# Patient Record
Sex: Male | Born: 1985 | Race: White | Hispanic: No | Marital: Single | Smoking: Never smoker
Health system: Southern US, Community
[De-identification: ages and names within clinical notes are randomized; demographics above are authoritative.]

## PROBLEM LIST (undated history)

## (undated) DIAGNOSIS — Z9660 Presence of unspecified orthopedic joint implant: Secondary | ICD-10-CM

## (undated) DIAGNOSIS — E761 Mucopolysaccharidosis, type II: Secondary | ICD-10-CM

## (undated) DIAGNOSIS — J9600 Acute respiratory failure, unspecified whether with hypoxia or hypercapnia: Secondary | ICD-10-CM

## (undated) DIAGNOSIS — Z9889 Other specified postprocedural states: Secondary | ICD-10-CM

## (undated) DIAGNOSIS — T884XXA Failed or difficult intubation, initial encounter: Secondary | ICD-10-CM

## (undated) DIAGNOSIS — D509 Iron deficiency anemia, unspecified: Secondary | ICD-10-CM

## (undated) DIAGNOSIS — Q6589 Other specified congenital deformities of hip: Secondary | ICD-10-CM

---

## 2011-04-19 ENCOUNTER — Ambulatory Visit: Payer: Self-pay

## 2011-06-27 ENCOUNTER — Encounter: Payer: Self-pay | Admitting: Orthopedic Surgery

## 2011-07-09 ENCOUNTER — Encounter: Payer: Self-pay | Admitting: Orthopedic Surgery

## 2011-11-21 ENCOUNTER — Encounter: Payer: Self-pay | Admitting: Orthopedic Surgery

## 2011-12-09 ENCOUNTER — Encounter: Payer: Self-pay | Admitting: Orthopedic Surgery

## 2012-05-23 ENCOUNTER — Ambulatory Visit: Payer: Self-pay | Admitting: Pediatrics

## 2012-08-29 ENCOUNTER — Ambulatory Visit: Payer: Self-pay | Admitting: Pediatrics

## 2013-07-19 ENCOUNTER — Emergency Department: Payer: Self-pay | Admitting: Emergency Medicine

## 2013-12-05 ENCOUNTER — Encounter: Payer: Self-pay | Admitting: Radiology

## 2013-12-08 ENCOUNTER — Encounter: Payer: Self-pay | Admitting: Radiology

## 2014-01-07 ENCOUNTER — Encounter: Payer: Self-pay | Admitting: Radiology

## 2014-04-21 ENCOUNTER — Encounter
Admit: 2014-04-21 | Disposition: A | Discharge status: Home / Self Care | Payer: Self-pay | Attending: Radiology | Admitting: Radiology

## 2014-05-09 ENCOUNTER — Encounter
Admit: 2014-05-09 | Disposition: A | Discharge status: Home / Self Care | Payer: Self-pay | Attending: Radiology | Admitting: Radiology

## 2015-04-27 ENCOUNTER — Emergency Department
Admission: EM | Admit: 2015-04-27 | Discharge: 2015-04-27 | Payer: Medicare Other | Attending: Emergency Medicine | Admitting: Emergency Medicine

## 2015-04-27 ENCOUNTER — Encounter: Payer: Self-pay | Admitting: Emergency Medicine

## 2015-04-27 ENCOUNTER — Emergency Department: Payer: Medicare Other

## 2015-04-27 DIAGNOSIS — E761 Mucopolysaccharidosis, type II: Secondary | ICD-10-CM | POA: Insufficient documentation

## 2015-04-27 DIAGNOSIS — J189 Pneumonia, unspecified organism: Secondary | ICD-10-CM

## 2015-04-27 DIAGNOSIS — J181 Lobar pneumonia, unspecified organism: Secondary | ICD-10-CM | POA: Insufficient documentation

## 2015-04-27 DIAGNOSIS — J9602 Acute respiratory failure with hypercapnia: Secondary | ICD-10-CM

## 2015-04-27 DIAGNOSIS — J9600 Acute respiratory failure, unspecified whether with hypoxia or hypercapnia: Secondary | ICD-10-CM | POA: Diagnosis not present

## 2015-04-27 DIAGNOSIS — Z7982 Long term (current) use of aspirin: Secondary | ICD-10-CM | POA: Diagnosis not present

## 2015-04-27 DIAGNOSIS — Z79899 Other long term (current) drug therapy: Secondary | ICD-10-CM | POA: Insufficient documentation

## 2015-04-27 DIAGNOSIS — A419 Sepsis, unspecified organism: Secondary | ICD-10-CM | POA: Insufficient documentation

## 2015-04-27 DIAGNOSIS — R0600 Dyspnea, unspecified: Secondary | ICD-10-CM | POA: Diagnosis present

## 2015-04-27 DIAGNOSIS — J9601 Acute respiratory failure with hypoxia: Secondary | ICD-10-CM

## 2015-04-27 HISTORY — DX: Mucopolysaccharidosis, type II: E76.1

## 2015-04-27 LAB — BLOOD GAS, ARTERIAL
ALLENS TEST (PASS/FAIL): POSITIVE — AB
Acid-Base Excess: 1.2 mmol/L (ref 0.0–3.0)
Acid-Base Excess: 2.9 mmol/L (ref 0.0–3.0)
Allens test (pass/fail): POSITIVE — AB
BICARBONATE: 29.9 meq/L — AB (ref 21.0–28.0)
BICARBONATE: 31.7 meq/L — AB (ref 21.0–28.0)
DELIVERY SYSTEMS: POSITIVE
EXPIRATORY PAP: 5
FIO2: 0.36
FIO2: 0.35
Inspiratory PAP: 18
O2 Saturation: 85.6 %
O2 Saturation: 89.4 %
PATIENT TEMPERATURE: 37
PATIENT TEMPERATURE: 37
PCO2 ART: 84 mmHg — AB (ref 32.0–48.0)
PO2 ART: 65 mmHg — AB (ref 83.0–108.0)
RATE: 8 resp/min
pCO2 arterial: 87 mmHg (ref 32.0–48.0)
pH, Arterial: 7.16 — CL (ref 7.350–7.450)
pH, Arterial: 7.17 — CL (ref 7.350–7.450)
pO2, Arterial: 72 mmHg — ABNORMAL LOW (ref 83.0–108.0)

## 2015-04-27 LAB — CBC WITH DIFFERENTIAL/PLATELET
BASOS ABS: 0 10*3/uL (ref 0–0.1)
EOS ABS: 0 10*3/uL (ref 0–0.7)
HCT: 17 % — ABNORMAL LOW (ref 40.0–52.0)
HEMOGLOBIN: 5 g/dL — AB (ref 13.0–18.0)
LYMPHS ABS: 2.7 10*3/uL (ref 1.0–3.6)
Lymphocytes Relative: 11 %
MCH: 24.1 pg — ABNORMAL LOW (ref 26.0–34.0)
MCHC: 29.7 g/dL — ABNORMAL LOW (ref 32.0–36.0)
MCV: 81.2 fL (ref 80.0–100.0)
Monocytes Absolute: 2 10*3/uL — ABNORMAL HIGH (ref 0.2–1.0)
Monocytes Relative: 8 %
Neutro Abs: 19.9 10*3/uL — ABNORMAL HIGH (ref 1.4–6.5)
PLATELETS: 919 10*3/uL — AB (ref 150–440)
RBC: 2.09 MIL/uL — AB (ref 4.40–5.90)
RDW: 17 % — ABNORMAL HIGH (ref 11.5–14.5)
WBC: 24.7 10*3/uL — AB (ref 3.8–10.6)

## 2015-04-27 LAB — COMPREHENSIVE METABOLIC PANEL
ALT: 36 U/L (ref 17–63)
AST: 38 U/L (ref 15–41)
Albumin: 3.7 g/dL (ref 3.5–5.0)
Alkaline Phosphatase: 148 U/L — ABNORMAL HIGH (ref 38–126)
Anion gap: 10 (ref 5–15)
BILIRUBIN TOTAL: 0.1 mg/dL — AB (ref 0.3–1.2)
BUN: 29 mg/dL — AB (ref 6–20)
CALCIUM: 8.7 mg/dL — AB (ref 8.9–10.3)
CHLORIDE: 100 mmol/L — AB (ref 101–111)
CO2: 26 mmol/L (ref 22–32)
CREATININE: 0.79 mg/dL (ref 0.61–1.24)
Glucose, Bld: 167 mg/dL — ABNORMAL HIGH (ref 65–99)
Potassium: 4.7 mmol/L (ref 3.5–5.1)
Sodium: 136 mmol/L (ref 135–145)
Total Protein: 7.4 g/dL (ref 6.5–8.1)

## 2015-04-27 LAB — TROPONIN I: TROPONIN I: 0.03 ng/mL (ref <0.031)

## 2015-04-27 LAB — APTT: aPTT: 29 seconds (ref 24–36)

## 2015-04-27 LAB — ABO/RH: ABO/RH(D): O NEG

## 2015-04-27 LAB — LACTIC ACID, PLASMA: LACTIC ACID, VENOUS: 3.1 mmol/L — AB (ref 0.5–2.0)

## 2015-04-27 LAB — TYPE AND SCREEN
ABO/RH(D): O NEG
ANTIBODY SCREEN: NEGATIVE

## 2015-04-27 LAB — PROTIME-INR
INR: 1.19
Prothrombin Time: 15.3 seconds — ABNORMAL HIGH (ref 11.4–15.0)

## 2015-04-27 MED ORDER — VANCOMYCIN HCL 10 G IV SOLR: 1000.0000 g | Freq: Once | INTRAVENOUS | Status: DC

## 2015-04-27 MED ORDER — VANCOMYCIN HCL IN DEXTROSE 1-5 GM/200ML-% IV SOLN
INTRAVENOUS | Status: AC
  Filled 2015-04-27: qty 200

## 2015-04-27 MED ORDER — VANCOMYCIN HCL IN DEXTROSE 1-5 GM/200ML-% IV SOLN
1000.0000 mg | Freq: Once | INTRAVENOUS | Status: AC
  Administered 2015-04-27: 1000 mg via INTRAVENOUS
  Filled 2015-04-27: qty 200

## 2015-04-27 MED ORDER — LORAZEPAM 2 MG/ML IJ SOLN
0.5000 mg | Freq: Once | INTRAMUSCULAR | Status: DC
  Filled 2015-04-27: qty 1

## 2015-04-27 MED ORDER — SODIUM CHLORIDE 0.9 % IV BOLUS (SEPSIS)
1000.0000 mL | Freq: Once | INTRAVENOUS | Status: AC
  Administered 2015-04-27: 1000 mL via INTRAVENOUS

## 2015-04-27 MED ORDER — PIPERACILLIN-TAZOBACTAM 3.375 G IVPB 30 MIN
3.3750 g | Freq: Once | INTRAVENOUS | Status: AC
  Administered 2015-04-27: 3.375 g via INTRAVENOUS
  Filled 2015-04-27: qty 50

## 2015-04-27 NOTE — ED Notes (Signed)
[facility] at bedside ready to leave; report given

## 2015-04-27 NOTE — ED Notes (Signed)
Pt presents to ED via EMS with respiratory distress. Tachycardic on arrival, pale appearance. Mother at bedside.

## 2015-04-27 NOTE — ED Provider Notes (Signed)
[facility] Emergency Department Provider Note  ____________________________________________  Time seen: Approximately 2:34 PM  I have reviewed the triage vital signs and the nursing notes.   HISTORY  Chief Complaint Respiratory Distress    HPI Alexander Chen is a 30 y.o. male with a history of Hunter's syndrome, recent right arthroplasty revision for hardware infection, presenting with respiratory distress. The patient is alert and able to answer some questions although he is confused. The remainder of the patient's history is obtained from his mother as well as [facility] documentation in care everywhere on Epic.  The patient's mother reports that for the last 5 days he has had a cough with fever to 100.3. He has had decreased sleep due to coughing, and increased from baseline anxiety. He has not had rhinorrhea or congestion, sore throat or ear pain. He has not had any syncopal episodes. He denies chest pain. The patient denies any pain, including right hip pain. EMS reports that on arrival, the patient had O2 sats in the mid 70s and CO2 also in the mid 70s.   Past Medical History  Diagnosis Date  . Huntington disease (HCC)     There are no active problems to display for this patient.   History reviewed. No pertinent past surgical history.  No current outpatient prescriptions on file.  Allergies Review of patient's allergies indicates no known allergies.  History reviewed. No pertinent family history.  Social History Social History  Substance Use Topics  . Smoking status: Never Smoker   . Smokeless tobacco: None  . Alcohol Use: No    Review of Systems Constitutional: Positive fever. No chills. No syncope. Eyes: No visual changes. No eye discharge. ENT: No sore throat. No congestion or rhinorrhea. Cardiovascular: Denies chest pain, palpitations. Respiratory: Positive shortness of breath.  Positive cough. Gastrointestinal: No abdominal pain.  No  nausea, no vomiting.  No diarrhea.  No constipation. Genitourinary: Negative for dysuria. Musculoskeletal: Negative for back pain. Negative for right hip pain. Skin: Negative for rash. Neurological: Negative for headaches, focal weakness or numbness. Positive altered mental status.  10-point ROS otherwise negative.  ____________________________________________   PHYSICAL EXAM:  VITAL SIGNS: ED Triage Vitals  Enc Vitals Group     BP 04/27/15 1424 159/92 mmHg     Pulse Rate 04/27/15 1424 135     Resp 04/27/15 1424 26     Temp 04/27/15 1424 98.1 F (36.7 C)     Temp Source 04/27/15 1424 Oral     SpO2 04/27/15 1424 100 %     Weight --      Height --      Head Cir --      Peak Flow --      Pain Score --      Pain Loc --      Pain Edu? --      Excl. in GC? --     Constitutional: Patient is alert and oriented to person only. He thinks he is in the ambulance and that is 2016. He is in respiratory distress on arrival. Patient is chronically ill-appearing with pallor Eyes: Conjunctivae are normal.  EOMI. no scleral icterus. No eye discharge. Head: Atraumatic. Nose: No congestion/rhinnorhea. Mouth/Throat: Mucous membranes are dry.  Neck: No stridor.  Supple.  Positive JVD. Cardiovascular: Rapid rate, regular rhythm. No murmurs, rubs or gallops.  Respiratory: Patient is in respiratory distress with tachypnea and able to speak only one to 2 word sentences. He has diffuse rales in the entire  left lung field without wheezing. Fair air exchange. On 4 L nasal cannula, O2 sats are in the mid 90s. Gastrointestinal: Soft and nontender. No distention. No peritoneal signs. Pelvis: Right surgical incision over the greater trochanter that is clean dry and intact without overlying erythema or discharge. No pain with palpation. Patient moves the right hip without discomfort.  Musculoskeletal: No LE edema. Diffuse contractures and scoliosis. Neurologic:  Patient is alert to person only. His speech  is clear. ASIS symmetric. Moves all extremities well. Skin:  Skin is warm, dry and intact. No rash noted. Positive pallor. Psychiatric: Affect is normal with anxious mood.  ____________________________________________   LABS (all labs ordered are listed, but only abnormal results are displayed)  Labs Reviewed  BLOOD GAS, ARTERIAL - Abnormal; Notable for the following:    pH, Arterial 7.16 (*)    pCO2 arterial 84 (*)    pO2, Arterial 65 (*)    Bicarbonate 29.9 (*)    Allens test (pass/fail) POSITIVE (*)    All other components within normal limits  CBC WITH DIFFERENTIAL/PLATELET - Abnormal; Notable for the following:    WBC 24.7 (*)    RBC 2.09 (*)    Hemoglobin 5.0 (*)    HCT 17.0 (*)    MCH 24.1 (*)    MCHC 29.7 (*)    RDW 17.0 (*)    Platelets 919 (*)    Neutro Abs 19.9 (*)    Monocytes Absolute 2.0 (*)    All other components within normal limits  PROTIME-INR - Abnormal; Notable for the following:    Prothrombin Time 15.3 (*)    All other components within normal limits  CULTURE, BLOOD (ROUTINE X 2)  CULTURE, BLOOD (ROUTINE X 2)  URINE CULTURE  APTT  COMPREHENSIVE METABOLIC PANEL  LACTIC ACID, PLASMA  LACTIC ACID, PLASMA  TROPONIN I  BLOOD GAS, VENOUS  URINALYSIS COMPLETEWITH MICROSCOPIC ([facility] ONLY)  TYPE AND SCREEN  ABO/RH   ____________________________________________  EKG  ED ECG REPORT I, Rockne Menghini, the attending physician, personally viewed and interpreted this ECG.   Date: 04/27/2015  EKG Time: 1423  Rate: 137  Rhythm: normal sinus rhythm  Axis: Normal  Intervals:none  ST&T Change: No ST elevation.  ____________________________________________  RADIOLOGY  Dg Chest Portable 1 View  04/27/2015  CLINICAL DATA:  Respiratory distress. Huntington's disease. Syncope today. EXAM: PORTABLE CHEST 1 VIEW COMPARISON:  04/19/2011 FINDINGS: The patient has a patchy infiltrate in the left lower lobe at the left lung base consistent with  pneumonia. Right lung is clear. Heart size and vascularity are normal. Tiny left effusion. Chronic severe thoracic scoliosis. IMPRESSION: Left lower lobe pneumonia. Electronically Signed   By: Francene Boyers M.D.   On: 04/27/2015 14:52    ____________________________________________   PROCEDURES  Procedure(s) performed: None  Critical Care performed: Yes, see critical care note(s) ____________________________________________   INITIAL IMPRESSION / ASSESSMENT AND PLAN / ED COURSE  Pertinent labs & imaging results that were available during my care of the patient were reviewed by me and considered in my medical decision making (see chart for details).  30 y.o. male with a history of Hunter's syndrome, recent right hip arthroplasty revision after infection, presenting with cough and respiratory distress. The patient had O2 sats in the 70s and CO2 level in the 70s I EMS. On arrival, his O2 sats was in the 80s on room air with a heart rate in the 130s and significant tachypnea. Clinically, he has a left-sided pneumonia and I will  treat him him. Clean with antibiotics and initiate fluid resuscitation. Mother describes that in the past he has required fiberoptic intubation with pediatric tools as he has a very small trachea. He is in significant respiratory distress, but continues to answer questions although he has some confusion. He does maintain his oxygen saturations when I give him supplemental O2. I have talked to his mom and the patient about intubation, and they are refusing at this time. We will immediately initiate BiPAP and get a blood gas to evaluate his respiratory status. I have contacted [facility] for immediate transfer, as the patient and his mother prefer to have the care there.  CRITICAL CARE Performed by: Rockne Menghini   Total critical care time: 60 minutes  Critical care time was exclusive of separately billable procedures and treating other patients.  Critical care was  necessary to treat or prevent imminent or life-threatening deterioration.  Critical care was time spent personally by me on the following activities: development of treatment plan with patient and/or surrogate as well as nursing, discussions with consultants, evaluation of patient's response to treatment, examination of patient, obtaining history from patient or surrogate, ordering and performing treatments and interventions, ordering and review of laboratory studies, ordering and review of radiographic studies, pulse oximetry and re-evaluation of patient's condition.   ----------------------------------------- 3:47 PM on 04/27/2015 -----------------------------------------  The patient was placed on BiPAP and at this time is breathing more comfortably with a decreased respiratory rate, O2 sats 100%, and a heart rate which is coming down to just over 100. He has received his antibiotics. [facility] for transfer and I am in discussions with [facility] about the fastest mode for transportation.  I had a long discussion with the patient's mother who is his POA. She continues to refuse intubation here. She understands the risks of transfer without intubation, including worsening of his status, worsening hypoxia, and respiratory arrest or death. She is willing to take that risk in order to transport the patient. At this time, all signs are that the patient is improving with our interventions, and I'm awaiting a call from [facility] for transportation.  ----------------------------------------- 4:59 PM on 04/27/2015 -----------------------------------------  The patient is being transported by [employer]. He does continue to have an O2 sat of 100% and has significantly improved his respiratory rate. At this time he is slightly somnolent but still arousable to voice. He is maintaining a normal blood pressure. A repeat ABG shows grossly unchanged pH but improving  hypercarbia and oxygenation. The patient is being transported in improving but still critical condition.  ____________________________________________  FINAL CLINICAL IMPRESSION(S) / ED DIAGNOSES  Final diagnoses:  Sepsis, due to unspecified organism (HCC)  Left lower lobe pneumonia  Acute respiratory failure with hypoxia and hypercapnia (HCC)      NEW MEDICATIONS STARTED DURING THIS VISIT:  New Prescriptions   No medications on file     Rockne Menghini, MD 04/27/15 2142

## 2015-05-02 LAB — CULTURE, BLOOD (ROUTINE X 2)
CULTURE: NO GROWTH
Culture: NO GROWTH

## 2015-05-14 ENCOUNTER — Other Ambulatory Visit: Payer: Self-pay | Admitting: Physician Assistant

## 2015-05-14 DIAGNOSIS — J189 Pneumonia, unspecified organism: Secondary | ICD-10-CM

## 2015-05-15 ENCOUNTER — Ambulatory Visit
Admission: RE | Admit: 2015-05-15 | Discharge: 2015-05-15 | Disposition: A | Discharge status: Home / Self Care | Payer: Medicare Other | Source: Ambulatory Visit | Attending: Physician Assistant | Admitting: Physician Assistant

## 2015-05-15 DIAGNOSIS — J189 Pneumonia, unspecified organism: Secondary | ICD-10-CM | POA: Diagnosis present

## 2015-05-23 ENCOUNTER — Encounter: Payer: Self-pay | Admitting: *Deleted

## 2015-05-23 ENCOUNTER — Inpatient Hospital Stay
Admission: EM | Admit: 2015-05-23 | Discharge: 2015-05-24 | DRG: 296 | Disposition: A | Discharge status: Other Acute Inpatient Hospital | Payer: Medicare Other | Attending: Internal Medicine | Admitting: Internal Medicine

## 2015-05-23 ENCOUNTER — Encounter
Admission: EM | Disposition: A | Discharge status: Other Acute Inpatient Hospital | Payer: Self-pay | Source: Home / Self Care | Attending: Internal Medicine

## 2015-05-23 ENCOUNTER — Emergency Department: Payer: Medicare Other

## 2015-05-23 DIAGNOSIS — I469 Cardiac arrest, cause unspecified: Principal | ICD-10-CM | POA: Diagnosis present

## 2015-05-23 DIAGNOSIS — D509 Iron deficiency anemia, unspecified: Secondary | ICD-10-CM | POA: Diagnosis present

## 2015-05-23 DIAGNOSIS — J9600 Acute respiratory failure, unspecified whether with hypoxia or hypercapnia: Secondary | ICD-10-CM | POA: Diagnosis present

## 2015-05-23 DIAGNOSIS — F419 Anxiety disorder, unspecified: Secondary | ICD-10-CM | POA: Diagnosis present

## 2015-05-23 DIAGNOSIS — Z79891 Long term (current) use of opiate analgesic: Secondary | ICD-10-CM

## 2015-05-23 DIAGNOSIS — G40409 Other generalized epilepsy and epileptic syndromes, not intractable, without status epilepticus: Secondary | ICD-10-CM | POA: Diagnosis present

## 2015-05-23 DIAGNOSIS — E872 Acidosis: Secondary | ICD-10-CM | POA: Diagnosis present

## 2015-05-23 DIAGNOSIS — Z4659 Encounter for fitting and adjustment of other gastrointestinal appliance and device: Secondary | ICD-10-CM

## 2015-05-23 DIAGNOSIS — R680 Hypothermia, not associated with low environmental temperature: Secondary | ICD-10-CM | POA: Diagnosis present

## 2015-05-23 DIAGNOSIS — Z7982 Long term (current) use of aspirin: Secondary | ICD-10-CM

## 2015-05-23 DIAGNOSIS — E761 Mucopolysaccharidosis, type II: Secondary | ICD-10-CM | POA: Diagnosis present

## 2015-05-23 DIAGNOSIS — J9602 Acute respiratory failure with hypercapnia: Secondary | ICD-10-CM | POA: Diagnosis present

## 2015-05-23 DIAGNOSIS — Z01818 Encounter for other preprocedural examination: Secondary | ICD-10-CM

## 2015-05-23 DIAGNOSIS — Z79899 Other long term (current) drug therapy: Secondary | ICD-10-CM

## 2015-05-23 DIAGNOSIS — Z7951 Long term (current) use of inhaled steroids: Secondary | ICD-10-CM

## 2015-05-23 DIAGNOSIS — J9601 Acute respiratory failure with hypoxia: Secondary | ICD-10-CM | POA: Diagnosis present

## 2015-05-23 HISTORY — DX: Other specified postprocedural states: Z98.890

## 2015-05-23 HISTORY — DX: Presence of unspecified orthopedic joint implant: Z96.60

## 2015-05-23 HISTORY — DX: Acute respiratory failure, unspecified whether with hypoxia or hypercapnia: J96.00

## 2015-05-23 HISTORY — DX: Iron deficiency anemia, unspecified: D50.9

## 2015-05-23 HISTORY — PX: INTUBATION-ENDOTRACHEAL WITH TRACHEOSTOMY STANDBY: SHX6592

## 2015-05-23 HISTORY — DX: Other specified congenital deformities of hip: Q65.89

## 2015-05-23 HISTORY — DX: Failed or difficult intubation, initial encounter: T88.4XXA

## 2015-05-23 LAB — CBC WITH DIFFERENTIAL/PLATELET
BASOS PCT: 0 %
Basophils Absolute: 0 10*3/uL (ref 0–0.1)
EOS ABS: 0.1 10*3/uL (ref 0–0.7)
EOS PCT: 1 %
HEMATOCRIT: 33.7 % — AB (ref 40.0–52.0)
HEMOGLOBIN: 11.2 g/dL — AB (ref 13.0–18.0)
LYMPHS PCT: 70 %
Lymphs Abs: 5.9 10*3/uL — ABNORMAL HIGH (ref 1.0–3.6)
MCH: 31.1 pg (ref 26.0–34.0)
MCHC: 33.3 g/dL (ref 32.0–36.0)
MCV: 93.3 fL (ref 80.0–100.0)
Monocytes Absolute: 0.5 10*3/uL (ref 0.2–1.0)
Monocytes Relative: 6 %
NEUTROS ABS: 2 10*3/uL (ref 1.4–6.5)
NEUTROS PCT: 23 %
Platelets: 355 10*3/uL (ref 150–440)
RBC: 3.62 MIL/uL — ABNORMAL LOW (ref 4.40–5.90)
RDW: 18.9 % — ABNORMAL HIGH (ref 11.5–14.5)
WBC: 8.5 10*3/uL (ref 3.8–10.6)

## 2015-05-23 LAB — URINALYSIS COMPLETE WITH MICROSCOPIC (ARMC ONLY)
BILIRUBIN URINE: NEGATIVE
Glucose, UA: 50 mg/dL — AB
HGB URINE DIPSTICK: NEGATIVE
Ketones, ur: NEGATIVE mg/dL
LEUKOCYTES UA: NEGATIVE
Nitrite: NEGATIVE
PH: 6 (ref 5.0–8.0)
PROTEIN: 100 mg/dL — AB
SQUAMOUS EPITHELIAL / LPF: NONE SEEN
Specific Gravity, Urine: 1.011 (ref 1.005–1.030)

## 2015-05-23 LAB — BASIC METABOLIC PANEL
Anion gap: 12 (ref 5–15)
BUN: 12 mg/dL (ref 6–20)
CHLORIDE: 106 mmol/L (ref 101–111)
CO2: 20 mmol/L — AB (ref 22–32)
CREATININE: 0.89 mg/dL (ref 0.61–1.24)
Calcium: 8 mg/dL — ABNORMAL LOW (ref 8.9–10.3)
GFR calc Af Amer: >60 mL/min (ref >60)
GFR calc non Af Amer: >60 mL/min (ref >60)
GLUCOSE: 285 mg/dL — AB (ref 65–99)
POTASSIUM: 4.2 mmol/L (ref 3.5–5.1)
SODIUM: 138 mmol/L (ref 135–145)

## 2015-05-23 LAB — TROPONIN I

## 2015-05-23 SURGERY — INTUBATION-ENDOTRACHEAL WITH TRACHEOSTOMY STANDBY
Anesthesia: General | Wound class: Clean

## 2015-05-23 MED ORDER — SODIUM CHLORIDE 0.9 % IV BOLUS (SEPSIS): 500.0000 mL | INTRAVENOUS | Status: DC

## 2015-05-23 MED ORDER — SODIUM CHLORIDE 0.9 % IV SOLN
INTRAVENOUS | Status: AC | PRN
  Administered 2015-05-23: 1000 mL via INTRAVENOUS

## 2015-05-23 MED ORDER — PIPERACILLIN-TAZOBACTAM 3.375 G IVPB 30 MIN
3.3750 g | Freq: Once | INTRAVENOUS | Status: AC
  Administered 2015-05-24: 3.375 g via INTRAVENOUS
  Filled 2015-05-23 (×2): qty 50

## 2015-05-23 MED ORDER — SODIUM CHLORIDE 0.9 % IV BOLUS (SEPSIS): 1000.0000 mL | Freq: Once | INTRAVENOUS | Status: DC

## 2015-05-23 MED ORDER — VANCOMYCIN HCL IN DEXTROSE 1-5 GM/200ML-% IV SOLN
1000.0000 mg | Freq: Once | INTRAVENOUS | Status: AC
  Administered 2015-05-24: 1000 mg via INTRAVENOUS
  Filled 2015-05-23 (×2): qty 200

## 2015-05-23 SURGICAL SUPPLY — 23 items
BLADE SURG 15 STRL LF DISP TIS (BLADE) IMPLANT
BLADE SURG 15 STRL SS (BLADE)
BLADE SURG SZ11 CARB STEEL (BLADE) IMPLANT
BRONCHOSCOPE PED SLIM DISP (MISCELLANEOUS) ×4 IMPLANT
CANISTER SUCT 1200ML W/VALVE (MISCELLANEOUS) ×4 IMPLANT
ELECT REM PT RETURN 9FT ADLT (ELECTROSURGICAL)
ELECTRODE REM PT RTRN 9FT ADLT (ELECTROSURGICAL) IMPLANT
GLOVE EXAM LX STRL 7.5 (GLOVE) IMPLANT
GOWN STRL REUS W/ TWL LRG LVL3 (GOWN DISPOSABLE) IMPLANT
GOWN STRL REUS W/TWL LRG LVL3 (GOWN DISPOSABLE)
HARMONIC SCALPEL FOCUS (MISCELLANEOUS) IMPLANT
NS IRRIG 500ML POUR BTL (IV SOLUTION) IMPLANT
PACK HEAD/NECK (MISCELLANEOUS) IMPLANT
SPONGE EXCIL AMD DRAIN 4X4 6P (MISCELLANEOUS) IMPLANT
SUCTION FRAZIER HANDLE 10FR (MISCELLANEOUS)
SUCTION TUBE FRAZIER 10FR DISP (MISCELLANEOUS) IMPLANT
SUT ETHILON 2 0 FS 18 (SUTURE) IMPLANT
SUT SILK 2 0 (SUTURE)
SUT SILK 2-0 18XBRD TIE 12 (SUTURE) IMPLANT
SUT VIC AB 4-0 RB1 27 (SUTURE)
SUT VIC AB 4-0 RB1 27X BRD (SUTURE) IMPLANT
TUBE TRACH SHILEY  6 DIST  CUF (TUBING) IMPLANT
TUBE TRACH SHILEY 8 DIST CUF (TUBING) IMPLANT

## 2015-05-23 NOTE — Anesthesia Preprocedure Evaluation (Addendum)
Anesthesia Evaluation  Patient identified by MRN, date of birth, ID band Patient confused    Reviewed: Allergy & Precautions, H&P , NPO status , Patient's Chart, lab work & pertinent test results  History of Anesthesia Complications Negative for: history of anesthetic complications  Airway Mallampati: Intubated  TM Distance: <3 FB Neck ROM: Limited    Dental   Pulmonary shortness of breath, asthma ,    Pulmonary exam normal breath sounds clear to auscultation       Cardiovascular Exercise Tolerance: Good (-) angina(-) Past MI and (-) DOE Normal cardiovascular exam Rhythm:regular Rate:Normal     Neuro/Psych negative neurological ROS  negative psych ROS   GI/Hepatic negative GI ROS, Neg liver ROS, neg GERD  ,  Endo/Other  negative endocrine ROS  Renal/GU negative Renal ROS  negative genitourinary   Musculoskeletal   Abdominal   Peds  Hematology negative hematology ROS (+)   Anesthesia Other Findings Past Medical History:   Hunter's syndrome, severe form (HCC)                         BMI    Body Mass Index   20.79 kg/m 2      Reproductive/Obstetrics negative OB ROS                             Anesthesia Physical Anesthesia Plan  ASA: IV and emergent  Anesthesia Plan: General ETT   Post-op Pain Management:    Induction: Inhalational  Airway Management Planned: Video Laryngoscope Planned, Fiberoptic Intubation Planned and Tracheostomy  Additional Equipment:   Intra-op Plan:   Post-operative Plan:   Informed Consent: I have reviewed the patients History and Physical, chart, labs and discussed the procedure including the risks, benefits and alternatives for the proposed anesthesia with the patient or authorized representative who has indicated his/her understanding and acceptance.   Dental Advisory Given  Plan Discussed with: Anesthesiologist, CRNA and Surgeon  Anesthesia  Plan Comments: (Patients mother consented.  She was informed that he is higher risk for complications from anesthesia during this procedure due to his medical history.  She voiced understanding. )       Anesthesia Quick Evaluation

## 2015-05-23 NOTE — Progress Notes (Signed)
Pharmacy Antibiotic Note  Sheryle SprayCody Soroka is a 30 y.o. male admitted on 05/23/2015 with sepsis.  Pharmacy has been consulted for Zosyn and vancomycin dosing.  Plan: 1. Zosyn 3.375 gm IV Q8H EI 2. Vancomycin 1 gm IV x 1 in ED followed by 750 mg IV Q12H, predicted trough 17 mcg/mL. Pharmacy will continue to follow and adjust as needed to maintain trough 15 to 20 mcg/mL.   Vd. 32.7 L, Ke 0.073 hr-1, T1/2 9.5 hr  Height: 4\' 11"  (149.9 cm) Weight: 103 lb (46.72 kg) IBW/kg (Calculated) : 47.7  Temp (24hrs), Avg:93.6 F (34.2 C), Min:90.7 F (32.6 C), Max:94.9 F (34.9 C)   Recent Labs Lab 05/23/15 2251  WBC 8.5  CREATININE 0.89    Estimated Creatinine Clearance: 82.4 mL/min (by C-G formula based on Cr of 0.89).    Allergies  Allergen Reactions  . Gabapentin Shortness Of Breath    Thank you for allowing pharmacy to be a part of this patient's care.  Carola FrostNathan A Hannan Tetzlaff, Pharm.D., BCPS Clinical Pharmacist 05/23/2015 11:58 PM

## 2015-05-23 NOTE — ED Notes (Addendum)
Pt recently dc'd from [facility] w/ dx of pneumonia. Brother called 911. Pt was being administered breathing treatment, began to have seizure-like activity. Family started CPR FOR 10-15 MINS prior to EMS arrival. Medical illustratorire dept. AED X 3 ROUNDS no shock advised, fire continued CPR via ElrosaLucas device. Pt has hx Hunter's syndrome. Mother and father present at bedside. EMS administered EPI X 1 AMP AND SOLUMEDROL 125 MG, return of pulses. [facility] airway in place.

## 2015-05-24 ENCOUNTER — Inpatient Hospital Stay: Payer: Medicare Other

## 2015-05-24 ENCOUNTER — Emergency Department: Payer: Medicare Other | Admitting: Anesthesiology

## 2015-05-24 ENCOUNTER — Inpatient Hospital Stay: Admit: 2015-05-24 | Payer: Medicare Other

## 2015-05-24 ENCOUNTER — Ambulatory Visit (HOSPITAL_COMMUNITY)
Admission: AD | Admit: 2015-05-24 | Discharge: 2015-05-24 | Disposition: A | Discharge status: Home / Self Care | Payer: Medicare Other | Source: Other Acute Inpatient Hospital | Attending: Internal Medicine | Admitting: Internal Medicine

## 2015-05-24 ENCOUNTER — Encounter: Payer: Self-pay | Admitting: Adult Health

## 2015-05-24 DIAGNOSIS — J9690 Respiratory failure, unspecified, unspecified whether with hypoxia or hypercapnia: Secondary | ICD-10-CM | POA: Insufficient documentation

## 2015-05-24 DIAGNOSIS — J9602 Acute respiratory failure with hypercapnia: Secondary | ICD-10-CM | POA: Diagnosis present

## 2015-05-24 DIAGNOSIS — J9601 Acute respiratory failure with hypoxia: Secondary | ICD-10-CM

## 2015-05-24 DIAGNOSIS — R680 Hypothermia, not associated with low environmental temperature: Secondary | ICD-10-CM | POA: Diagnosis present

## 2015-05-24 DIAGNOSIS — D509 Iron deficiency anemia, unspecified: Secondary | ICD-10-CM | POA: Diagnosis present

## 2015-05-24 DIAGNOSIS — I469 Cardiac arrest, cause unspecified: Secondary | ICD-10-CM | POA: Diagnosis present

## 2015-05-24 DIAGNOSIS — Z79891 Long term (current) use of opiate analgesic: Secondary | ICD-10-CM | POA: Diagnosis not present

## 2015-05-24 DIAGNOSIS — F419 Anxiety disorder, unspecified: Secondary | ICD-10-CM | POA: Diagnosis present

## 2015-05-24 DIAGNOSIS — Z79899 Other long term (current) drug therapy: Secondary | ICD-10-CM | POA: Diagnosis not present

## 2015-05-24 DIAGNOSIS — Z7982 Long term (current) use of aspirin: Secondary | ICD-10-CM | POA: Diagnosis not present

## 2015-05-24 DIAGNOSIS — E761 Mucopolysaccharidosis, type II: Secondary | ICD-10-CM | POA: Diagnosis present

## 2015-05-24 DIAGNOSIS — Z7951 Long term (current) use of inhaled steroids: Secondary | ICD-10-CM | POA: Diagnosis not present

## 2015-05-24 DIAGNOSIS — G40409 Other generalized epilepsy and epileptic syndromes, not intractable, without status epilepticus: Secondary | ICD-10-CM | POA: Diagnosis present

## 2015-05-24 DIAGNOSIS — E872 Acidosis: Secondary | ICD-10-CM | POA: Diagnosis present

## 2015-05-24 DIAGNOSIS — J9600 Acute respiratory failure, unspecified whether with hypoxia or hypercapnia: Secondary | ICD-10-CM | POA: Diagnosis present

## 2015-05-24 LAB — BLOOD GAS, ARTERIAL
Acid-Base Excess: 2.7 mmol/L (ref 0.0–3.0)
Acid-base deficit: 0.6 mmol/L (ref 0.0–2.0)
BICARBONATE: 26.2 meq/L (ref 21.0–28.0)
Bicarbonate: 27.9 mEq/L (ref 21.0–28.0)
FIO2: 0.28
FIO2: 0.25
MECHANICAL RATE: 12
MECHVT: 380 mL
Mechanical Rate: 14
O2 SAT: 98.3 %
O2 Saturation: 98.8 %
PATIENT TEMPERATURE: 37
PCO2 ART: 45 mmHg (ref 32.0–48.0)
PEEP: 5 cmH2O
PEEP: 5 cmH2O
PO2 ART: 135 mmHg — AB (ref 83.0–108.0)
PO2 ART: 111 mmHg — AB (ref 83.0–108.0)
Patient temperature: 37
VT: 380 mL
pCO2 arterial: 52 mmHg — ABNORMAL HIGH (ref 32.0–48.0)
pH, Arterial: 7.31 — ABNORMAL LOW (ref 7.350–7.450)
pH, Arterial: 7.4 (ref 7.350–7.450)

## 2015-05-24 LAB — BASIC METABOLIC PANEL
Anion gap: 6 (ref 5–15)
BUN: 15 mg/dL (ref 6–20)
CHLORIDE: 111 mmol/L (ref 101–111)
CO2: 24 mmol/L (ref 22–32)
CREATININE: 0.72 mg/dL (ref 0.61–1.24)
Calcium: 7.9 mg/dL — ABNORMAL LOW (ref 8.9–10.3)
GFR calc non Af Amer: >60 mL/min (ref >60)
Glucose, Bld: 147 mg/dL — ABNORMAL HIGH (ref 65–99)
POTASSIUM: 4 mmol/L (ref 3.5–5.1)
SODIUM: 141 mmol/L (ref 135–145)

## 2015-05-24 LAB — LACTIC ACID, PLASMA
LACTIC ACID, VENOUS: 1.2 mmol/L (ref 0.5–2.0)
Lactic Acid, Venous: 2 mmol/L (ref 0.5–2.0)

## 2015-05-24 LAB — PROCALCITONIN

## 2015-05-24 LAB — CBC
HCT: 32.7 % — ABNORMAL LOW (ref 40.0–52.0)
HEMOGLOBIN: 10.7 g/dL — AB (ref 13.0–18.0)
MCH: 30.4 pg (ref 26.0–34.0)
MCHC: 32.7 g/dL (ref 32.0–36.0)
MCV: 92.7 fL (ref 80.0–100.0)
Platelets: 258 10*3/uL (ref 150–440)
RBC: 3.53 MIL/uL — AB (ref 4.40–5.90)
RDW: 18.4 % — ABNORMAL HIGH (ref 11.5–14.5)
WBC: 6.8 10*3/uL (ref 3.8–10.6)

## 2015-05-24 LAB — GLUCOSE, CAPILLARY
GLUCOSE-CAPILLARY: 102 mg/dL — AB (ref 65–99)
GLUCOSE-CAPILLARY: 92 mg/dL (ref 65–99)
GLUCOSE-CAPILLARY: 110 mg/dL — AB (ref 65–99)
Glucose-Capillary: 152 mg/dL — ABNORMAL HIGH (ref 65–99)

## 2015-05-24 LAB — FIBRIN DERIVATIVES D-DIMER (ARMC ONLY): Fibrin derivatives D-dimer (ARMC): 1513 — ABNORMAL HIGH (ref 0–499)

## 2015-05-24 LAB — MAGNESIUM: MAGNESIUM: 2.1 mg/dL (ref 1.7–2.4)

## 2015-05-24 LAB — PHOSPHORUS: Phosphorus: 4 mg/dL (ref 2.5–4.6)

## 2015-05-24 LAB — ALBUMIN: ALBUMIN: 3.4 g/dL — AB (ref 3.5–5.0)

## 2015-05-24 LAB — MRSA PCR SCREENING: MRSA by PCR: NEGATIVE

## 2015-05-24 MED ORDER — ACETAMINOPHEN 650 MG RE SUPP: 650.0000 mg | Freq: Four times a day (QID) | RECTAL | Status: DC | PRN

## 2015-05-24 MED ORDER — FENTANYL 2500MCG IN NS 250ML (10MCG/ML) PREMIX INFUSION
25.0000 ug/h | INTRAVENOUS | Status: DC
  Administered 2015-05-24: 25 ug/h via INTRAVENOUS
  Filled 2015-05-24: qty 250

## 2015-05-24 MED ORDER — MEPERIDINE HCL 25 MG/ML IJ SOLN
INTRAMUSCULAR | Status: AC
  Filled 2015-05-24: qty 2

## 2015-05-24 MED ORDER — LEVETIRACETAM 500 MG/5ML IV SOLN: 1000.0000 mg | Freq: Two times a day (BID) | INTRAVENOUS | Status: DC

## 2015-05-24 MED ORDER — VECURONIUM BROMIDE 10 MG IV SOLR
10.0000 mg | Freq: Once | INTRAVENOUS | Status: AC
  Administered 2015-05-24: 10 mg via INTRAVENOUS
  Filled 2015-05-24: qty 10

## 2015-05-24 MED ORDER — ACETAMINOPHEN 160 MG/5ML PO SOLN
650.0000 mg | Freq: Four times a day (QID) | ORAL | Status: DC | PRN
Start: 2015-05-24 — End: 2015-05-24
  Administered 2015-05-24: 650 mg
  Filled 2015-05-24: qty 20.3

## 2015-05-24 MED ORDER — SODIUM CHLORIDE 0.9 % IV SOLN
1000.0000 mg | Freq: Once | INTRAVENOUS | Status: AC
  Administered 2015-05-24: 1000 mg via INTRAVENOUS
  Filled 2015-05-24: qty 10

## 2015-05-24 MED ORDER — CHLORHEXIDINE GLUCONATE 0.12% ORAL RINSE (MEDLINE KIT)
15.0000 mL | Freq: Two times a day (BID) | OROMUCOSAL | Status: DC
  Administered 2015-05-24: 15 mL via OROMUCOSAL
  Filled 2015-05-24 (×2): qty 15

## 2015-05-24 MED ORDER — ROCURONIUM BROMIDE 100 MG/10ML IV SOLN
INTRAVENOUS | Status: DC | PRN
  Administered 2015-05-24: 20 mg via INTRAVENOUS

## 2015-05-24 MED ORDER — LORAZEPAM 2 MG/ML IJ SOLN
INTRAMUSCULAR | Status: AC
  Filled 2015-05-24: qty 2

## 2015-05-24 MED ORDER — IPRATROPIUM-ALBUTEROL 0.5-2.5 (3) MG/3ML IN SOLN
3.0000 mL | Freq: Four times a day (QID) | RESPIRATORY_TRACT | Status: DC
  Filled 2015-05-24: qty 3

## 2015-05-24 MED ORDER — ACETAMINOPHEN 10 MG/ML IV SOLN
1000.0000 mg | Freq: Once | INTRAVENOUS | Status: AC
  Administered 2015-05-24: 1000 mg via INTRAVENOUS
  Filled 2015-05-24: qty 100

## 2015-05-24 MED ORDER — ALBUTEROL SULFATE HFA 108 (90 BASE) MCG/ACT IN AERS
INHALATION_SPRAY | RESPIRATORY_TRACT | Status: DC | PRN
  Administered 2015-05-24: 3 via RESPIRATORY_TRACT

## 2015-05-24 MED ORDER — LACTATED RINGERS IV SOLN
INTRAVENOUS | Status: DC
  Administered 2015-05-24: 01:00:00 via INTRAVENOUS

## 2015-05-24 MED ORDER — HEPARIN SODIUM (PORCINE) 5000 UNIT/ML IJ SOLN
5000.0000 [IU] | Freq: Three times a day (TID) | INTRAMUSCULAR | Status: DC
  Administered 2015-05-24 (×2): 5000 [IU] via SUBCUTANEOUS
  Filled 2015-05-24: qty 1

## 2015-05-24 MED ORDER — DEXAMETHASONE SODIUM PHOSPHATE 10 MG/ML IJ SOLN
INTRAMUSCULAR | Status: DC | PRN
  Administered 2015-05-24: 5 mg via INTRAVENOUS

## 2015-05-24 MED ORDER — FENTANYL BOLUS VIA INFUSION
50.0000 ug | INTRAVENOUS | Status: DC | PRN
  Filled 2015-05-24: qty 50

## 2015-05-24 MED ORDER — SODIUM CHLORIDE 0.9 % IV SOLN: 250.0000 mL | INTRAVENOUS | Status: DC | PRN

## 2015-05-24 MED ORDER — SODIUM CHLORIDE 0.9 % IV SOLN
24.0000 mg | Freq: Once | INTRAVENOUS | Status: AC
  Administered 2015-05-24: 24 mg via INTRAVENOUS
  Filled 2015-05-24: qty 12

## 2015-05-24 MED ORDER — MEPERIDINE HCL 25 MG/ML IJ SOLN
50.0000 mg | Freq: Once | INTRAMUSCULAR | Status: AC
  Administered 2015-05-24: 50 mg via INTRAVENOUS

## 2015-05-24 MED ORDER — ANTISEPTIC ORAL RINSE SOLUTION (CORINZ)
7.0000 mL | OROMUCOSAL | Status: DC
Start: 2015-05-24 — End: 2015-05-24
  Administered 2015-05-24 (×3): 7 mL via OROMUCOSAL
  Filled 2015-05-24 (×10): qty 7

## 2015-05-24 MED ORDER — PANTOPRAZOLE SODIUM 40 MG IV SOLR
40.0000 mg | Freq: Every day | INTRAVENOUS | Status: DC
  Administered 2015-05-24: 40 mg via INTRAVENOUS
  Filled 2015-05-24: qty 40

## 2015-05-24 MED ORDER — MIDAZOLAM HCL 5 MG/5ML IJ SOLN
INTRAMUSCULAR | Status: AC
  Filled 2015-05-24: qty 5

## 2015-05-24 MED ORDER — MIDAZOLAM HCL 2 MG/2ML IJ SOLN
INTRAMUSCULAR | Status: AC
  Administered 2015-05-24: 2 mg via INTRAVENOUS
  Filled 2015-05-24: qty 2

## 2015-05-24 MED ORDER — BISACODYL 10 MG RE SUPP: 10.0000 mg | Freq: Every day | RECTAL | Status: DC | PRN

## 2015-05-24 MED ORDER — ALBUTEROL SULFATE (2.5 MG/3ML) 0.083% IN NEBU: 2.5000 mg | INHALATION_SOLUTION | RESPIRATORY_TRACT | Status: DC | PRN

## 2015-05-24 MED ORDER — FENTANYL CITRATE (PF) 100 MCG/2ML IJ SOLN
50.0000 ug | Freq: Once | INTRAMUSCULAR | Status: AC
  Administered 2015-05-24: 50 ug via INTRAVENOUS
  Filled 2015-05-24: qty 2

## 2015-05-24 MED ORDER — VANCOMYCIN HCL IN DEXTROSE 750-5 MG/150ML-% IV SOLN
750.0000 mg | Freq: Two times a day (BID) | INTRAVENOUS | Status: DC
  Administered 2015-05-24: 750 mg via INTRAVENOUS
  Filled 2015-05-24 (×2): qty 150

## 2015-05-24 MED ORDER — PROPOFOL 1000 MG/100ML IV EMUL
INTRAVENOUS | Status: AC
  Filled 2015-05-24: qty 100

## 2015-05-24 MED ORDER — SODIUM CHLORIDE 0.9 % IV SOLN
500.0000 mg | Freq: Two times a day (BID) | INTRAVENOUS | Status: DC
  Filled 2015-05-24: qty 5

## 2015-05-24 MED ORDER — MIDAZOLAM HCL 2 MG/2ML IJ SOLN
2.0000 mg | INTRAMUSCULAR | Status: DC | PRN
Start: 2015-05-24 — End: 2015-05-24
  Administered 2015-05-24: 2 mg via INTRAVENOUS

## 2015-05-24 MED ORDER — MIDAZOLAM HCL 5 MG/ML IJ SOLN
1.0000 mg/h | INTRAMUSCULAR | Status: DC
  Administered 2015-05-24: 1 mg/h via INTRAVENOUS
  Administered 2015-05-24 (×2): 10 mg/h via INTRAVENOUS
  Filled 2015-05-24 (×3): qty 10

## 2015-05-24 MED ORDER — MIDAZOLAM HCL 2 MG/2ML IJ SOLN: 2.0000 mg | INTRAMUSCULAR | Status: DC | PRN

## 2015-05-24 MED ORDER — SENNOSIDES 8.8 MG/5ML PO SYRP: 5.0000 mL | ORAL_SOLUTION | Freq: Two times a day (BID) | ORAL | Status: DC | PRN

## 2015-05-24 MED ORDER — PIPERACILLIN-TAZOBACTAM 3.375 G IVPB
3.3750 g | Freq: Three times a day (TID) | INTRAVENOUS | Status: DC
Start: 2015-05-24 — End: 2015-05-24
  Administered 2015-05-24: 3.375 g via INTRAVENOUS
  Filled 2015-05-24 (×3): qty 50

## 2015-05-24 MED ORDER — LORAZEPAM 2 MG/ML IJ SOLN
4.0000 mg | Freq: Once | INTRAMUSCULAR | Status: AC
  Administered 2015-05-24: 4 mg via INTRAVENOUS

## 2015-05-24 MED ORDER — ONDANSETRON HCL 4 MG/2ML IJ SOLN: 4.0000 mg | Freq: Four times a day (QID) | INTRAMUSCULAR | Status: DC | PRN

## 2015-05-24 NOTE — ED Notes (Signed)
Zosyn and Vancocin pulled from main ED pyxis and sent with CRNAs to OR.

## 2015-05-24 NOTE — Progress Notes (Signed)
Spoke to Dr. Nicholos Johnsamachandran about patient's general condition and albumin level of 3.4. MD corrected calcium and stated he would not order calcium replacement at this time. Enzyme infusion changed to 24 mL/hr per instructions, patient with HR mid 90s, RR low 90s, no rash, and patient still febrile but lowering slightly. RN and family at bedside, twitching still present at decreased frequency and intensity (only in trunk and face now not in arms).

## 2015-05-24 NOTE — Progress Notes (Signed)
Patient rolled out with [facility]. New versed drip spiked and scanned and hung to go with [facility] during transfer.

## 2015-05-24 NOTE — Progress Notes (Signed)
Brief Nutrition Note  Consult received for enteral/tube feeding initiation and management. Spoke with Dr Ardyth Manam and does not want to start tube feeding at this time.  Pt will likely transfer.  Full assessment to follow if unable to transfer  Admitting Dx: Cardiac arrest (HCC) [I46.9]  Body mass index is 21.32 kg/(m^2).    Labs:   Recent Labs Lab 05/23/15 2251 05/24/15 0131  NA 138 141  K 4.2 4.0  CL 106 111  CO2 20* 24  BUN 12 15  CREATININE 0.89 0.72  CALCIUM 8.0* 7.9*  MG  --  2.1  PHOS  --  4.0  GLUCOSE 285* 147*    Samyuktha Brau B. Freida BusmanAllen, RD, LDN [phone] (pager) Weekend/On-Call pager [phone])

## 2015-05-24 NOTE — Progress Notes (Signed)
[facility] Patient to move to Critical Care Room 19. Family aware of transfer and at bedside with [facility].

## 2015-05-24 NOTE — Progress Notes (Signed)
Informed Dr. Nicholos Johnsamachandran about continued muscle twitching in upper extremities and calcium value of 7.9 with no albumin value. MD ordered RN to order ionized calcium check. RN also informed MD about urine turning red/pink from yellow (no clots visible). Patient with old blood in mouth when cleaned mouth but no other signs of bleeding at this time. No new orders about urine, besides for RN to continue to monitor.

## 2015-05-24 NOTE — Progress Notes (Signed)
Infusion of enzyme begun at 8 mL/hr. Patient already with fever, ST in low 100s., RR in low 20s. Family and RN at bedside.

## 2015-05-24 NOTE — Anesthesia Postprocedure Evaluation (Signed)
Anesthesia Post Note  Patient: Alexander Chen  Procedure(s) Performed: Procedure(s): INTUBATION-ENDOTRACHEAL WITH TRACHEOSTOMY STANDBY  Patient location during evaluation: SICU Anesthesia Type: General Level of consciousness: sedated Pain management: pain level controlled Vital Signs Assessment: post-procedure vital signs reviewed and stable Respiratory status: patient remains intubated per anesthesia plan Cardiovascular status: stable Anesthetic complications: no    Last Vitals:  Filed Vitals:   05/23/15 2328 05/23/15 2338  BP: 165/96 130/79  Pulse: 88 82  Temp: 34.8 C 34.9 C  Resp: 14 14    Last Pain: There were no vitals filed for this visit.               Cleda MccreedyJoseph K Piscitello

## 2015-05-24 NOTE — Transfer of Care (Signed)
Immediate Anesthesia Transfer of Care Note  Patient: Alexander Chen  Procedure(s) Performed: Procedure(s): INTUBATION-ENDOTRACHEAL WITH TRACHEOSTOMY STANDBY  Patient Location: ICU  Anesthesia Type:General  Level of Consciousness: sedated  Airway & Oxygen Therapy: Patient remains intubated per anesthesia plan  Post-op Assessment: Report given to RN and Post -op Vital signs reviewed and stable  Post vital signs: Reviewed and stable  Last Vitals:  Filed Vitals:   05/23/15 2328 05/23/15 2338  BP: 165/96 130/79  Pulse: 88 82  Temp: 34.8 C 34.9 C  Resp: 14 14    Complications: No apparent anesthesia complications

## 2015-05-24 NOTE — Progress Notes (Signed)
Patient had a tonic-clonic seizure at about 05:15am. Seizure lasted about 4 minutes. He continues to have "jerky movements" in bilateral upper extremities. Versed 4mg  IV push given, keppra 1 gram IV and 500 mg Q12H, Continuous EEG and neurology consult ordered.  [facility]. Pola CornELINK MD notified about family's request. Recommended covering MD should initiate [facility] transfer during day shift. Patient's parents notified.

## 2015-05-24 NOTE — ED Provider Notes (Signed)
[facility] Emergency Department Provider Note  ____________________________________________  Time seen: Seen upon arrival to the emergency department  I have reviewed the triage vital signs and the nursing notes.   HISTORY  Chief Complaint Cardiac Arrest    HPI Alexander SprayCody Chen is a 30 y.o. male with a history of Hunter's syndrome and recent discharge from this hospital for pneumonia who is presenting to the emergency department tonight post code. Per his family he was sitting calmly at home when he had a sudden onset of anxiety. The then described his neck is turning red in him saying that he was having difficulty breathing. They then attempted to put an oxygen mask on him but the patient then turned blue, stiffened and seemed to have seizure activity and then went unresponsive. The family started CPR at home and then called 911. They performed CPR for about 15 minutes until the medics arrived. At that point [facility] was established and CPR was continued. EMS gave one dose of epinephrine and had spontaneous return of circulation.  The family said that he was in a good state of health prior to this acute onset of the event this evening. The mother is suspecting that the event was brought on by the anxiety. She said there was a similar episode about one month ago but that it did not result in the patient needing CPR and his heart stopping.  Per the medics, the initial rhythm was pulseless electrical activity.  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   Past Medical History  Diagnosis Date  . Hunter's syndrome, severe form (HCC)     There are no active problems to display for this patient.   History reviewed. No pertinent past surgical history.  Current Outpatient Rx  Name  Route  Sig  Dispense  Refill   . acetaminophen (TYLENOL) 500 MG tablet   Oral   Take 1,000 mg by mouth every 6 (six) hours as needed for mild pain or headache.         . albuterol (PROVENTIL HFA;VENTOLIN HFA) 108 (90 Base) MCG/ACT inhaler   Inhalation   Inhale 2 puffs into the lungs every 4 (four) hours as needed for wheezing or shortness of breath.         . ALPRAZolam (XANAX) 0.25 MG tablet   Oral   Take 0.25 mg by mouth daily as needed for anxiety.          Marland Kitchen. aspirin EC 81 MG tablet   Oral   Take 81 mg by mouth daily.         . Calcium Carbonate-Vitamin D (CALCIUM 600+D) 600-400 MG-UNIT tablet   Oral   Take 1 tablet by mouth 2 (two) times daily.         . citalopram (CELEXA) 20 MG tablet   Oral   Take 20 mg by mouth daily. Pt takes with a 40mg  tablet.         . citalopram (CELEXA) 40 MG tablet   Oral   Take 40 mg by mouth daily. Pt takes with a 20mg  tablet.         . Idursulfase (ELAPRASE) 6 MG/3ML SOLN   Intravenous   Inject 30 mg into the vein once a week. Medication is infused over three hours.         .Marland Kitchen  oxyCODONE (OXY IR/ROXICODONE) 5 MG immediate release tablet   Oral   Take 5-15 mg by mouth every 3 (three) hours as needed for severe pain.           Allergies Gabapentin  History reviewed. No pertinent family history.  Social History Social History  Substance Use Topics  . Smoking status: Never Smoker   . Smokeless tobacco: None  . Alcohol Use: No    Review of Systems  Caveat secondary to patient being unresponsive.  ____________________________________________   PHYSICAL EXAM:  VITAL SIGNS: ED Triage Vitals  Enc Vitals Group     BP 05/23/15 2230 167/94 mmHg     Pulse Rate 05/23/15 2230 85     Resp 05/23/15 2230 10     Temp 05/23/15 2242 92.7 F (33.7 C)     Temp Source 05/23/15 2242 Rectal     SpO2 05/23/15 2230 100 %     Weight 05/23/15 2344 103 lb (46.72 kg)     Height 05/23/15 2344  (1.499 m)     Head Cir --      Peak Flow --      Pain  Score --      Pain Loc --      Pain Edu? --      Excl. in GC? --     Constitutional: Patient taking agonal respirations with a King airway in. No purposeful movement otherwise. Eyes: Conjunctivae are normal. Pupils are 3-4 mm and fixed bilaterally. Head: Atraumatic. Nose: No congestion/rhinnorhea. Mouth/Throat: King airway in place. Neck: No stridor.   Cardiovascular: Normal rate, regular rhythm. Grossly normal heart sounds.  Good peripheral circulation. Respiratory: Spontaneous, agonal respirations. Bilateral breath sounds heard. Gastrointestinal: Soft with mild distention.  Musculoskeletal: No lower extremity edema.  No joint effusions. Neurologic:  GCS of 3 Skin:  Skin is warm, dry and intact. No rash noted.   ____________________________________________   LABS (all labs ordered are listed, but only abnormal results are displayed)  Labs Reviewed  CBC WITH DIFFERENTIAL/PLATELET - Abnormal; Notable for the following:    RBC 3.62 (*)    Hemoglobin 11.2 (*)    HCT 33.7 (*)    RDW 18.9 (*)    Lymphs Abs 5.9 (*)    All other components within normal limits  BASIC METABOLIC PANEL - Abnormal; Notable for the following:    CO2 20 (*)    Glucose, Bld 285 (*)    Calcium 8.0 (*)    All other components within normal limits  URINALYSIS COMPLETEWITH MICROSCOPIC ([facility] ONLY) - Abnormal; Notable for the following:    Color, Urine YELLOW (*)    APPearance CLEAR (*)    Glucose, UA 50 (*)    Protein, ur 100 (*)    Bacteria, UA RARE (*)    All other components within normal limits  CULTURE, BLOOD (ROUTINE X 2)  CULTURE, BLOOD (ROUTINE X 2)  URINE CULTURE  TROPONIN I  LACTIC ACID, PLASMA  LACTIC ACID, PLASMA   ____________________________________________  EKG  ED ECG REPORT I, Schaevitz,  Teena Irani, the attending physician, personally viewed and interpreted this ECG.   Date: 05/24/2015  EKG Time: 2229  Rate: 91  Rhythm: normal sinus rhythm  Axis: Normal  Intervals:none   ST&T Change: No ST segment elevation or depression. No abnormal T-wave inversion.  ____________________________________________  RADIOLOGY      DG Chest 1 View (Final result) Result time: 05/23/15 23:56:36   Final result by Rad Results In Interface (05/23/15 23:56:36)  Narrative:   CLINICAL DATA: Respiratory distress. Seizure like activity.  EXAM: CHEST 1 VIEW  COMPARISON: 05/15/2015  FINDINGS: A single supine portable view the chest is negative for large pneumothorax or large effusion. The lungs are grossly clear. Mediastinal and hilar contours are unremarkable unchanged.  IMPRESSION: No acute findings.   Electronically Signed By: Ellery Plunk M.D. On: 05/23/2015 23:56    ____________________________________________   PROCEDURES  CRITICAL CARE Performed by: Arelia Longest   Total critical care time: 35 minutes  Critical care time was exclusive of separately billable procedures and treating other patients.  Critical care was necessary to treat or prevent imminent or life-threatening deterioration.  Critical care was time spent personally by me on the following activities: development of treatment plan with patient and/or surrogate as well as nursing, discussions with consultants, evaluation of patient's response to treatment, examination of patient, obtaining history from patient or surrogate, ordering and performing treatments and interventions, ordering and review of laboratory studies, ordering and review of radiographic studies, pulse oximetry and re-evaluation of patient's condition.  ____________________________________________   INITIAL IMPRESSION / ASSESSMENT AND PLAN / ED COURSE  Pertinent labs & imaging results that were available during my care of the patient were reviewed by me and considered in my medical decision making (see chart for details).  ----------------------------------------- 12:31 AM on  05/24/2015 -----------------------------------------  Patient was saturating 100% with reassuring vital signs except for his hypothermia. Because the mother said that he has been a very difficult intubation in the past, anesthesia as well as ear nose and throat were called to the bedside. There was a prolonged discussion had with the mother who initially was opposed to the patient being intubated because of the difficult airway in the past. Both the anesthesiologist and myself discussed with her the need to convert the [facility] airway over to an endotracheal tube for airway protection reasons. It was my concern that, not having a definitive airway in, that the patient may aspirate and further damage including death may be caused. I also explained to the parents that due to the patient's prolonged CPR that he may have sustained brain injury from not having the benefits of the normal beating heart.  The mother suggested at one point that we keep the [facility] airway and until the patient "wakes up." However, I do not feel that this is a safe option at this time due to the recent events. It is unclear exactly what caused the patient's initial arrest. Possible upper airway obstruction versus sepsis, versus PE. Dr. Earnie Larsson of anesthesia as well as Dr.Jeungle of ENT will be attended the patient in the operating room. I discussed case with the ICU attending, Dr. Darrick Penna, who has accepted the patient to the ICU. The mother is also aware that a surgical airway may be needed. ____________________________________________   FINAL CLINICAL IMPRESSION(S) / ED DIAGNOSES  Cardiac arrest.    Myrna Blazer, MD 05/24/15 [phone]

## 2015-05-24 NOTE — Progress Notes (Signed)
Report called to [facility] RN 

## 2015-05-24 NOTE — Progress Notes (Addendum)
Enzyme infusion rate changed to 16 mL/hr per instructions. RN and family at bedside. HR in upper 90s SR, RR in low 20s, no signs of rash, patient still febrile but temperature has not increased at this time. Patient's muscle twitching appears to be decreasing in frequency and intensity per RN and family observation.

## 2015-05-24 NOTE — Progress Notes (Signed)
Rate of enzyme infusion increased to 32 mL/hr per instructions. HR in low 90s, RR in low 20s, still febrile but lowering slightly, no rash. Family present.

## 2015-05-24 NOTE — H&P (Signed)
PULMONARY / CRITICAL CARE MEDICINE   Name: Alexander Chen MRN: [id number] DOB: 02/16/1985    ADMISSION DATE:  05/23/2015  REFERRING MD:  ED  CHIEF COMPLAINT:  Cardiopulmonary arrest  HISTORY OF PRESENT ILLNESS:  This is a 30 year old Caucasian male with a past medical history of Hunter syndrome, iron deficiency anemia, and respiratory failure who presented to the emergency room via EMS following cardiopulmonary arrest at home. History is obtained from EMS and ED records, as well as from patient's parents. Patient is currently intubated and unresponsive. Per ED records and patient's family, patient was receiving a breathing treatment for SOB when he had a seizure-like activity, became unresponsive and stopped breathing. CPR was initiated by his parents and continued for about 15 minutes prior to EMS arrival. Upon EMS arrival, CPR was continued, patient was given 1 dose of epinephrine IV push, Solu-Medrol 125 mg and a King airway was placed. He did have return of spontaneous circulation and was transported to the emergency room. Total downtime estimated at 25 minutes and initial rhythm was PEA. Prior to event, patient reported feeling SOB. Patient has a prior history of a difficult airway (i.e. Small trachea), hence ENT was called to evaluate patient emergently for possible oral intubation or a tracheostomy. Patient was taken emergently to the operating room and intubated orally by ENT. He is currently hemodynamically stable with mean arterial blood pressures ranging between 90 and 110. Hypothermic with core temperature of 34.1 degrees celsius.  Patient's parents state that he was recently hospitalized and treated for pneumonia  At [facility] about a month ago and was discharged home with nebulizer treatments. He reported feeling short of breath frequently post-hospitalization but his home O2 saturation was normal. Per his mother, about a week ago, he had an episode where he complained of shortness of breath and  suddenly his "eyes rolled back", he clinched  his teeth and became unresponsive, had twitching in his upper extremities and had urinary incontinence. Episode lasted a few minutes and he recovered completely.  Patient's family have requested that patient be transferred to [facility] center so he can be consulted by his Geneticist [facility] syndrome). Transfer to [facility] initiated.  PAST MEDICAL HISTORY :  He  has a past medical history of Hunter's syndrome, severe form (HCC); Acetabular dysplasia; H/O arthroplasty; Iron deficiency anemia; Acute respiratory failure (HCC); and Difficult intubation.  PAST SURGICAL HISTORY: He  has no past surgical history on file.  Allergies  Allergen Reactions  . Gabapentin Shortness Of Breath    No current facility-administered medications on file prior to encounter.   Current Outpatient Prescriptions on File Prior to Encounter  Medication Sig  . acetaminophen (TYLENOL) 500 MG tablet Take 1,000 mg by mouth every 6 (six) hours as needed for mild pain or headache.  . albuterol (PROVENTIL HFA;VENTOLIN HFA) 108 (90 Base) MCG/ACT inhaler Inhale 2 puffs into the lungs every 4 (four) hours as needed for wheezing or shortness of breath.  . ALPRAZolam (XANAX) 0.25 MG tablet Take 0.25 mg by mouth daily as needed for anxiety.   Marland Kitchen aspirin EC 81 MG tablet Take 81 mg by mouth daily.  . Calcium Carbonate-Vitamin D (CALCIUM 600+D) 600-400 MG-UNIT tablet Take 1 tablet by mouth 2 (two) times daily.  . citalopram (CELEXA) 20 MG tablet Take 20 mg by mouth daily. Pt takes with a  tablet.  . citalopram (CELEXA) 40 MG tablet Take 40 mg by mouth daily. Pt takes with a  tablet.  . Idursulfase (  ELAPRASE) 6 MG/3ML SOLN Inject 30 mg into the vein once a week. Medication is infused over three hours.  Marland Kitchen. oxyCODONE (OXY IR/ROXICODONE) 5 MG immediate release tablet Take 5-15 mg by mouth every 3 (three) hours as needed for severe pain.    FAMILY HISTORY:  His indicated  that his mother is alive.   SOCIAL HISTORY: He  reports that he has never smoked. He does not have any smokeless tobacco history on file. He reports that he does not drink alcohol or use illicit drugs.  REVIEW OF SYSTEMS:   Unable to obtain  SUBJECTIVE:   VITAL SIGNS: BP 123/78 mmHg  Pulse 87  Temp(Src) 93.2 F (34 C) (Other (Comment))  Resp 17  Ht 4\' 11"  (1.499 m)  Wt 103 lb (46.72 kg)  BMI 20.79 kg/m2  SpO2 100%  HEMODYNAMICS:    VENTILATOR SETTINGS: Vent Mode:  [-] PRVC FiO2 (%):  [28 %-100 %] 28 % Set Rate:  [12 bmp] 12 bmp Vt Set:  [380 mL] 380 mL PEEP:  [5 cmH20] 5 cmH20  INTAKE / OUTPUT:    PHYSICAL EXAMINATION: General:  Sedated Neuro: Unresponsive to noxious stimulus, twitching upper extremities>lower extremities HEENT: Pupils dilated and fixed and unreactive,  ETT, oral cavity with moderate secretions Cardiovascular: RRR, S1/S2, no MRG Lungs: Bilateral airflow, breath sounds diminished in the bases, no wheezes and rhonchi Abdomen: Non-distended; normal bowel sounds Musculoskeletal: Contractures, and deformities in BLLE Ext: +1 edema, +2 pulses bilaterally Skin:  Warm, dry, multiple healed surgical scars  LABS:  BMET  Recent Labs Lab 05/23/15 2251  NA 138  K 4.2  CL 106  CO2 20*  BUN 12  CREATININE 0.89  GLUCOSE 285*    Electrolytes  Recent Labs Lab 05/23/15 2251  CALCIUM 8.0*    CBC  Recent Labs Lab 05/23/15 2251  WBC 8.5  HGB 11.2*  HCT 33.7*  PLT 355    Coag's No results for input(s): APTT, INR in the last 168 hours.  Sepsis Markers  Recent Labs Lab 05/23/15 2353  LATICACIDVEN 1.2    ABG No results for input(s): PHART, PCO2ART, PO2ART in the last 168 hours.  Liver Enzymes No results for input(s): AST, ALT, ALKPHOS, BILITOT, ALBUMIN in the last 168 hours.  Cardiac Enzymes  Recent Labs Lab 05/23/15 2251  TROPONINI <0.03    Glucose  Recent Labs Lab 05/24/15 0126  GLUCAP 152*    Imaging Dg Chest  1 View  05/23/2015  CLINICAL DATA:  Respiratory distress.  Seizure like activity. EXAM: CHEST 1 VIEW COMPARISON:  05/15/2015 FINDINGS: A single supine portable view the chest is negative for large pneumothorax or large effusion. The lungs are grossly clear. Mediastinal and hilar contours are unremarkable unchanged. IMPRESSION: No acute findings. Electronically Signed   By: Ellery Plunkaniel R Mitchell M.D.   On: 05/23/2015 23:56    STUDIES:  2-D echo pending EKG: NSR  CULTURES: 05/23/15 Blood> Urine> Sputum>  ANTIBIOTICS: Vancomycin Zosyn  SIGNIFICANT EVENTS: 05/23/15>Cardiopulmonary arrest>ED>Admitted 04/16>[facility] family's [facility]  LINES/TUBES: PIVs ETT placed 04/16  DISCUSSION: 30 yo WM with cardiopulmonary arrest, and acute hypoxic/hypercarbic respiratory failure secondary; now unresponsive post-resuscitation. Possible etiologies include new onset seizures, severe hypoxemia and hypercarbia. He is currently hemodynamically stable post oral intubation by ENT.   ASSESSMENT / PLAN:  PULMONARY A: Acute hypoxic and hypercarbic respiratory failure Acute respiratory acidosis P:   -Full vent support. -Stat ABG-reviewed, vent settings adjusted -VAP Protocol  -Nebulized bronchodilators -Stat chest x-ray  CARDIOVASCULAR A:  PEA cardiac arrest P:  -Cycle cardiac enzymes. -2-D echo -Hemodynamic monitoring per ICU protocol -Daily EKG -IV fluids -Patient is not a candidate for hypothermia protocol secondary to persistent myoclonus post resuscitation, hypothermia and uncertain quality of CPR/duration of hypoxia prior to EMS arrival   GASTROINTESTINAL A:   GI prophylaxis P:   -PPI  HEMATOLOGIC A:   Anemia-iron deficiency versus chronic disease, hemoglobin stable at 11.2 VTE prophylaxis P:  -Subcutaneous heparin -Monitor CBC  INFECTIOUS A:   Hypothermia, Rule out sepsis P:   -Empiric antibiotics. -Follow-up cultures -MRSA screen, if negative,  discontinue vancomycin  ENDOCRINE A:   Hyperglycemia without history of diabetes  P:   -Monitor blood glucose, will start sliding scale insulin coverage if blood glucose level is consistently greater than 200  Musculoskeletal  A: History of hip fracture, status posts arthroplasty/revision of total hip replacement P: -Hip precautions  NEUROLOGIC A:   Myoclonus s/p cardiopulmonary arrest Acute hypoxic encephalopathy-remains unresponsive post ROSC  P:   RASS goal: 0 -Stat CT head -Hold off on neuro consult and continuous EEG pending transfer to [facility] -Fentanyl and Versed for sedation, comfort and myoclonus -Vecuronium 10 mg IV 1 prior to CT head  Disposition and family update: Patient will be transferred to [facility] per family's request. He is a full code  Best Practice: Code Status:  Full. Diet: npo GI prophylaxis:  PPI. VTE prophylaxis:  SCD's / heparin.   Magdalene S. [facility] ANP-BC Pulmonary and Critical Care [facility] Pager [phone]   05/24/2015, 2:04 AM  Pt seen and examined and agree with NP. Presentation due to possible seizure activity with CP arrest. Now with continue myoclonus like activity, not responding to anti-seizure meds.  Discussed with family at bedside, Elink MD, family would like transfer to [facility]. Initially accepted but no bed was available. Given presence of continued tonic clonic activity, pt needs transfer asap for continuous EEG monitoring, which is not available at [facility]. There per family request, discussed with on call physician at [facility], as they are also familiar with the patient. They have accepted the patient and will be transporting him to their facility.   Nicholos Johns, M.D.  05/24/2015

## 2015-05-24 NOTE — Anesthesia Procedure Notes (Addendum)
Procedure Name: Intubation Date/Time: 05/24/2015 12:40 AM Performed by: Sherron FlemingsHARVEY, DUSTIN Pre-anesthesia Checklist: Patient identified, Patient being monitored, Timeout performed, Emergency Drugs available and Suction available Patient Re-evaluated:Patient Re-evaluated prior to inductionOxygen Delivery Method: Circle system utilized Preoxygenation: Pre-oxygenation with 100% oxygen Intubation Type: Inhalational induction Laryngoscope Size: 3 and Glidescope Grade View: Grade I Tube type: Oral Tube size: 6.0 mm Number of attempts: 1 Airway Equipment and Method: Fiberoptic brochoscope Placement Confirmation: ETT inserted through vocal cords under direct vision,  positive ETCO2 and breath sounds checked- equal and bilateral Secured at: 21 cm Tube secured with: Tape Dental Injury: Teeth and Oropharynx as per pre-operative assessment  Difficulty Due To: Difficulty was anticipated, Difficult Airway- due to large tongue and Difficult Airway- due to reduced neck mobility Future Recommendations: Recommend- awake intubation Comments: Cords visualized though king airway using fiberoptic scope.  Unable to pass wire.  Patient paralyzed. King airway removed.  Grade 1 view with Glidescope size 3.  Very Anterior. ETT passed off of fiberoptic scope due to cords being so anterior.

## 2015-05-24 NOTE — Discharge Summary (Signed)
Physician Discharge Summary  Patient ID: Alexander Chen MRN: [id number] DOB/AGE: 04/13/1985 30 y.o.  Admit date: 05/23/2015 Discharge date: 05/24/2015    Discharge Diagnoses:  -PEA cardiac arrest. -Acute hypoxic/hypercarbic respiratory failure. -Acute respiratory acidosis. -Rule out New-onset seizures. -Rule out sepsis                                                                     DISCHARGE PLAN BY DIAGNOSIS      Discharge Plan: Transfer to [facility] center                    DISCHARGE SUMMARY   Alexander Chen is a 30 y.o. y/o male with a PMH of Hunter syndrome, iron deficiency anemia, and respiratory failure who presented to the emergency room via EMS following cardiopulmonary arrest at home. History is obtained from EMS and ED records, as well as from patient's parents. Patient is currently intubated and unresponsive. Per ED records and patient's family, patient was receiving a breathing treatment when he had a seizure-like activity, became unresponsive and stopped breathing. CPR was initiated by his parents and continued for about 15 minutes prior to EMS arrival. Upon EMS arrival, CPR was continued, patient was given 1 dose of epinephrine IV push, Solu-Medrol 125 mg and a King airway was placed. He did have return of spontaneous circulation and was transported to the emergency room. Total downtime estimated at 25 minutes and initial rhythm was PEA  See H&P for detailed HPI            SIGNIFICANT DIAGNOSTIC STUDIES -CT head without contrast -CXR -EKG -Echocardiogram-pending  MICRO DATA  Blood urine and sputum cultures pending  ANTIBIOTICS Vancomycin and Zosyn started empirically while awaiting culture results  CONSULTS ENT for intubation due to difficult airway  TUBES / LINES ETT Peripheral IVs  Discharge Exam: General: Sedated Neuro: Unresponsive to noxious stimulus, twitching upper extremities>lower extremities HEENT: Pupils dilated and fixed  and unreactive, ETT, oral cavity with moderate secretions Cardiovascular: RRR, S1/S2, no MRG Lungs: Bilateral airflow, breath sounds diminished in the bases, no wheezes and rhonchi Abdomen: Non-distended; normal bowel sounds Musculoskeletal: Contractures, and deformities in BLLE Ext: +1 edema, +2 pulses bilaterally Skin: Warm, dry, multiple healed surgical scars  Filed Vitals:   05/24/15 0130 05/24/15 0145 05/24/15 0200 05/24/15 0215  BP:  123/78 131/93 129/78  Pulse: 89 87 85 91  Temp: 93.2 F (34 C) 93.2 F (34 C) 93.4 F (34.1 C) 93.7 F (34.3 C)  TempSrc:      Resp: Height:      Weight:      SpO2: 100% 100% 100% 100%     Discharge Labs  BMET  Recent Labs Lab 05/23/15 2251 05/24/15 0131  NA 138 141  K 4.2 4.0  CL 106 111  CO2 20* 24  GLUCOSE 285* 147*  BUN 12 15  CREATININE 0.89 0.72  CALCIUM 8.0* 7.9*  MG  --  2.1  PHOS  --  4.0    CBC  Recent Labs Lab 05/23/15 2251 05/24/15 0131  HGB 11.2* 10.7*  HCT 33.7* 32.7*  WBC 8.5 6.8  PLT 355 258    Anti-Coagulation No results for input(s): INR in the  last 168 hours.  HOME MEDICATIONS   Medication List    ASK your doctor about these medications        acetaminophen 500 MG tablet  Commonly known as:  TYLENOL  Take 1,000 mg by mouth every 6 (six) hours as needed for mild pain or headache.     albuterol 108 (90 Base) MCG/ACT inhaler  Commonly known as:  PROVENTIL HFA;VENTOLIN HFA  Inhale 2 puffs into the lungs every 4 (four) hours as needed for wheezing or shortness of breath.     ALPRAZolam 0.25 MG tablet  Commonly known as:  XANAX  Take 0.25 mg by mouth daily as needed for anxiety.     aspirin EC 81 MG tablet  Take 81 mg by mouth daily.     CALCIUM 600+D 600-400 MG-UNIT tablet  Generic drug:  Calcium Carbonate-Vitamin D  Take 1 tablet by mouth 2 (two) times daily.     citalopram 40 MG tablet  Commonly known as:  CELEXA  Take 40 mg by mouth daily. Pt takes with a   tablet.     citalopram 20 MG tablet  Commonly known as:  CELEXA  Take 20 mg by mouth daily. Pt takes with a  tablet.     ELAPRASE 6 MG/3ML Soln  Generic drug:  Idursulfase  Inject 30 mg into the vein once a week. Medication is infused over three hours.     oxyCODONE 5 MG immediate release tablet  Commonly known as:  Oxy IR/ROXICODONE  Take 5-15 mg by mouth every 3 (three) hours as needed for severe pain.        Hospital Medications  Current facility-administered medications:  .  0.9 %  sodium chloride infusion, 250 mL, Intravenous, PRN, Lewie Loron, NP .  acetaminophen (TYLENOL) solution 650 mg, 650 mg, Per Tube, Q6H PRN **OR** acetaminophen (TYLENOL) suppository 650 mg, 650 mg, Rectal, Q6H PRN, Lewie Loron, NP .  acetaminophen (TYLENOL) suppository 650 mg, 650 mg, Rectal, Q6H PRN, Magadalene S Tukov, NP .  albuterol (PROVENTIL) (2.5 MG/3ML) 0.083% nebulizer solution 2.5 mg, 2.5 mg, Nebulization, Q4H PRN, Lewie Loron, NP .  bisacodyl (DULCOLAX) suppository 10 mg, 10 mg, Rectal, Daily PRN, Lewie Loron, NP .  fentaNYL (SUBLIMAZE) bolus via infusion 50 mcg, 50 mcg, Intravenous, Q1H PRN, Lewie Loron, NP .  fentaNYL in NS (16mcg/ml) infusion-PREMIX, 25-400 mcg/hr, Intravenous, Continuous, Lewie Loron, NP, Last Rate: 2.5 mL/hr at 05/24/15 0238, 25 mcg/hr at 05/24/15 0238 .  heparin injection 5,000 Units, 5,000 Units, Subcutaneous, 3 times per day, Lewie Loron, NP, 5,000 Units at 05/24/15 0141 .  ipratropium-albuterol (DUONEB) 0.5-2.5 (3) MG/3ML nebulizer solution 3 mL, 3 mL, Nebulization, Q6H, Magadalene S Tukov, NP .  lactated ringers infusion, , Intravenous, Continuous, Lewie Loron, NP, Last Rate: 75 mL/hr at 05/24/15 0129 .  midazolam (VERSED) injection 2 mg, 2 mg, Intravenous, Q15 min PRN, Lewie Loron, NP .  midazolam (VERSED) injection 2 mg, 2 mg, Intravenous, Q2H PRN, Lewie Loron, NP, 2 mg at  05/24/15 0131 .  ondansetron (ZOFRAN) injection 4 mg, 4 mg, Intravenous, Q6H PRN, Lewie Loron, NP .  pantoprazole (PROTONIX) injection 40 mg, 40 mg, Intravenous, QHS, Magadalene S Tukov, NP, 40 mg at 05/24/15 0141 .  piperacillin-tazobactam (ZOSYN) IVPB 3.375 g, 3.375 g, Intravenous, 3 times per day, Myrna Blazer, MD .  sennosides (SENOKOT) 8.8 MG/5ML syrup 5 mL, 5 mL, Per Tube, BID PRN, Magadalene S  Luci Bankukov, NP .  vancomycin (VANCOCIN) IVPB 750 mg/150 ml premix, 750 mg, Intravenous, Q12H, Myrna Blazeravid Matthew Schaevitz, MD .  vecuronium (NORCURON) injection 10 mg, 10 mg, Intravenous, Once, Lewie LoronMagadalene S Tukov, NP  Hospital Course:  [facility] from the ED after presenting from home via EMS following a cardiopulmonary arrest with successful resuscitation by EMS. [facility] with a size 6 ETT. Patient was stabilized and routine post-resuscitation care was initaited. CT head pending. Patient's parents have requested [facility] where he can be followed by his geneticist who treats him for Hunter's syndrome. Patient has been accepted by Dr. Donnie Ahoilley. Awaiting bed assignment and final transfer.  Discharged Condition: Intubated, sedated with normal vital signs  Time spent to prepare discharge plan, coordinate transfer and review current disposition with family is:  Greater than 60 minutes.   Signed:  [facility]dalene [facility]. [facility] ANP-BC Pulmonary and Critical Care Medicine [facility] Pager [phone]

## 2015-05-24 NOTE — Progress Notes (Signed)
Assisted with this patient in ED. Post cpr via ems. Patient with minimal respiratory efforts up [facility] BBS noted per ER MD. Assisted with ambu bag tube ventilation for a little over an hour until patient was transferred to OR for intubation. HR noted 70's/80's RR20 saturation 100% entire time, end tidal noted in 40's with ambu bag ventilation.

## 2015-05-24 NOTE — Progress Notes (Signed)
Spoke with Dr. Nicholos Johnsamachandran on the phone about patient's temperature even after oral tylenol administration, that results for ionized calcium won't come back today per lab, and patient still twitching even with versed at max dose. MD ordered IV tylenol and a cooling blanket and an albumin level to correct calcium with.

## 2015-05-25 ENCOUNTER — Encounter: Payer: Self-pay | Admitting: Otolaryngology

## 2015-05-25 LAB — CALCIUM, IONIZED

## 2015-05-27 LAB — URINE CULTURE: CULTURE: NO GROWTH

## 2015-05-29 LAB — CULTURE, BLOOD (ROUTINE X 2)
CULTURE: NO GROWTH
Culture: NO GROWTH

## 2015-06-01 NOTE — Op Note (Signed)
06/01/2015  8:44 AM    Alexander Chen, Alexander Chen  [id number] Surgery Date  05/23/2015  Pre-Op Dx:  Airway obstruction, unstable airway  Post-op Dx: Airway obstruction, unstable airway  Proc: No surgical procedure done. Airway was obtained by anesthesia   Surg:  [name]  Anes:  GOT  EBL:  None  Comp:  None  Findings:  Anesthesia was able to intubate the patient  Procedure: The patient was brought to the operating room and prepared for possible emergency tracheostomy. The anesthesiologist was able to intubate the patient using the flexible bronchoscope. No surgical procedure was done. The patient was taken to the ICU with a stable airway  Dispo:   To ICU be evaluated there  Plan:  Does not need further ENT intervention at this time  [name]  06/01/2015 8:44 AM

## 2015-06-08 DEATH — deceased

## 2016-09-17 IMAGING — DX DG CHEST 1V
1 series · 1 of 1 positions shown · non-contrast
Comparison: 05/15/2015

CLINICAL DATA: Respiratory distress.  Seizure like activity.

EXAM:
CHEST 1 VIEW

[chest ap]
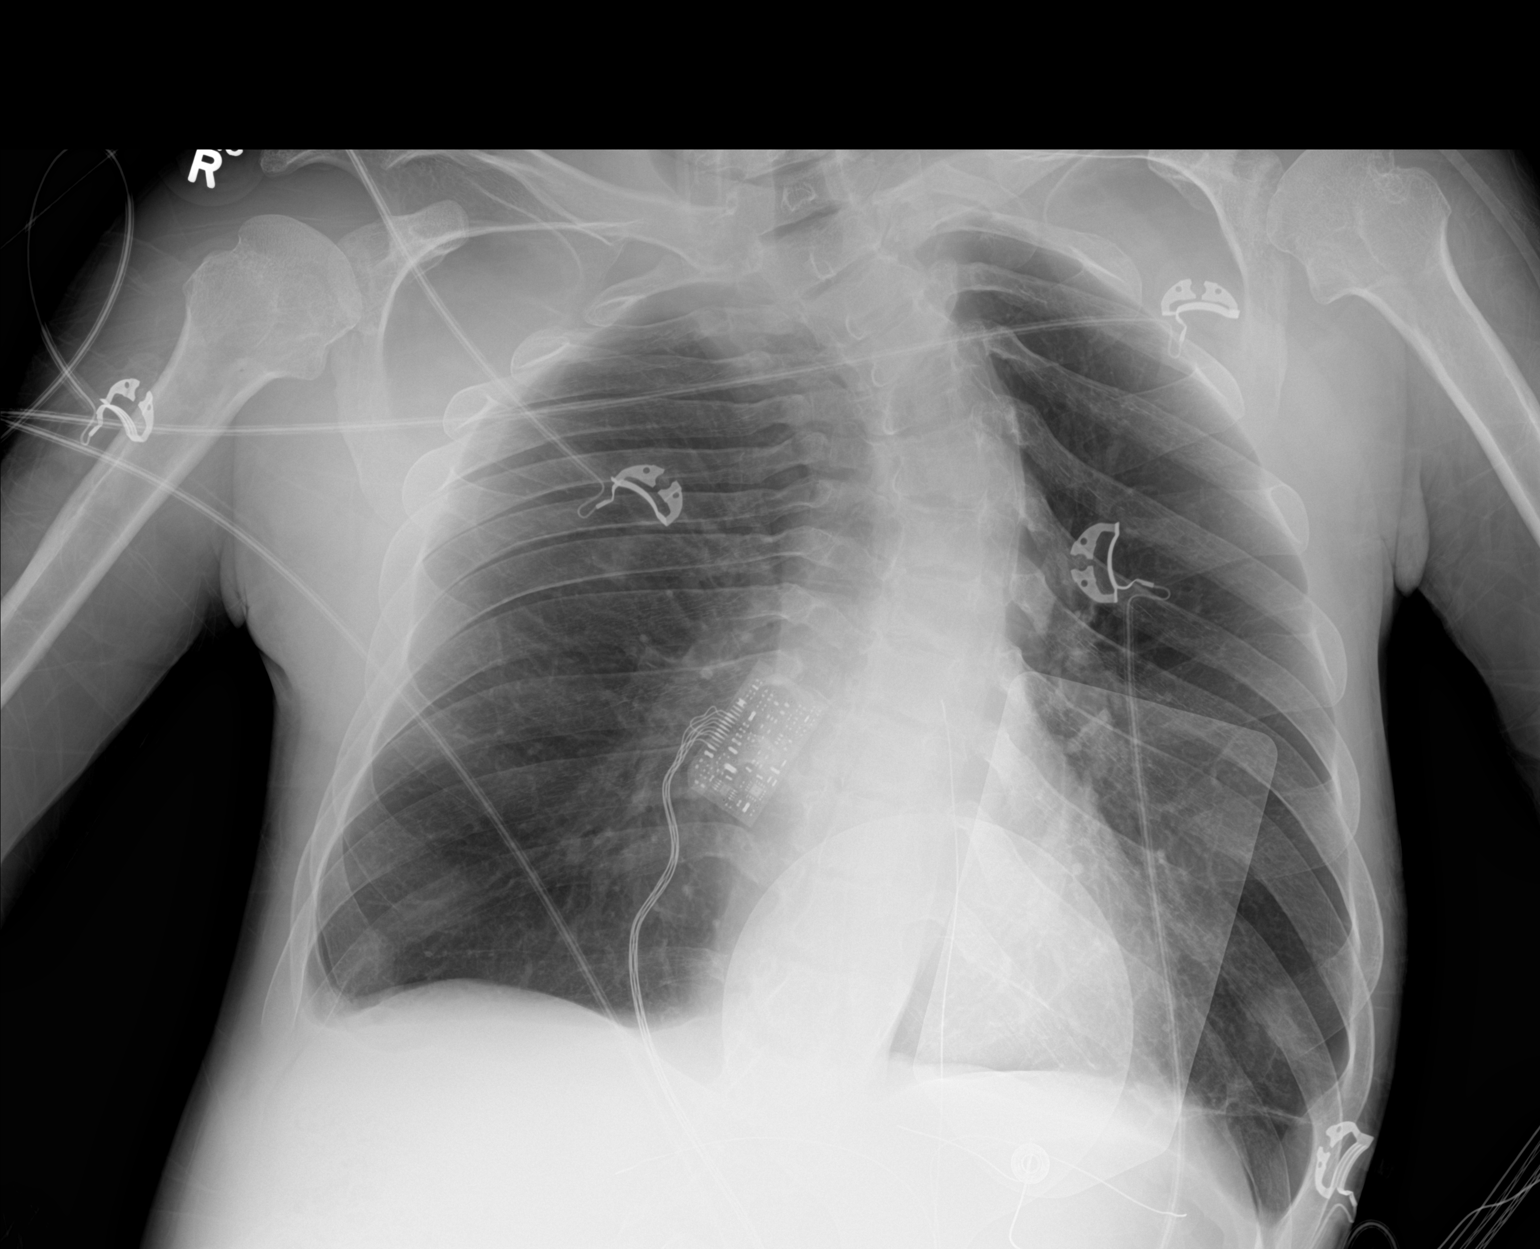

[1 of 1 positions shown; findings below may reference images not displayed]

FINDINGS: A single supine portable view the chest is negative for large
pneumothorax or large effusion. The lungs are grossly clear.
Mediastinal and hilar contours are unremarkable unchanged.
IMPRESSION: No acute findings.

## 2016-09-18 IMAGING — DX DG CHEST 1V
1 series · 1 of 1 positions shown · non-contrast
Comparison: 05/23/2015 at [DATE].

CLINICAL DATA: Resuscitation.  Intubation.

EXAM:
CHEST 1 VIEW

[chest ap]
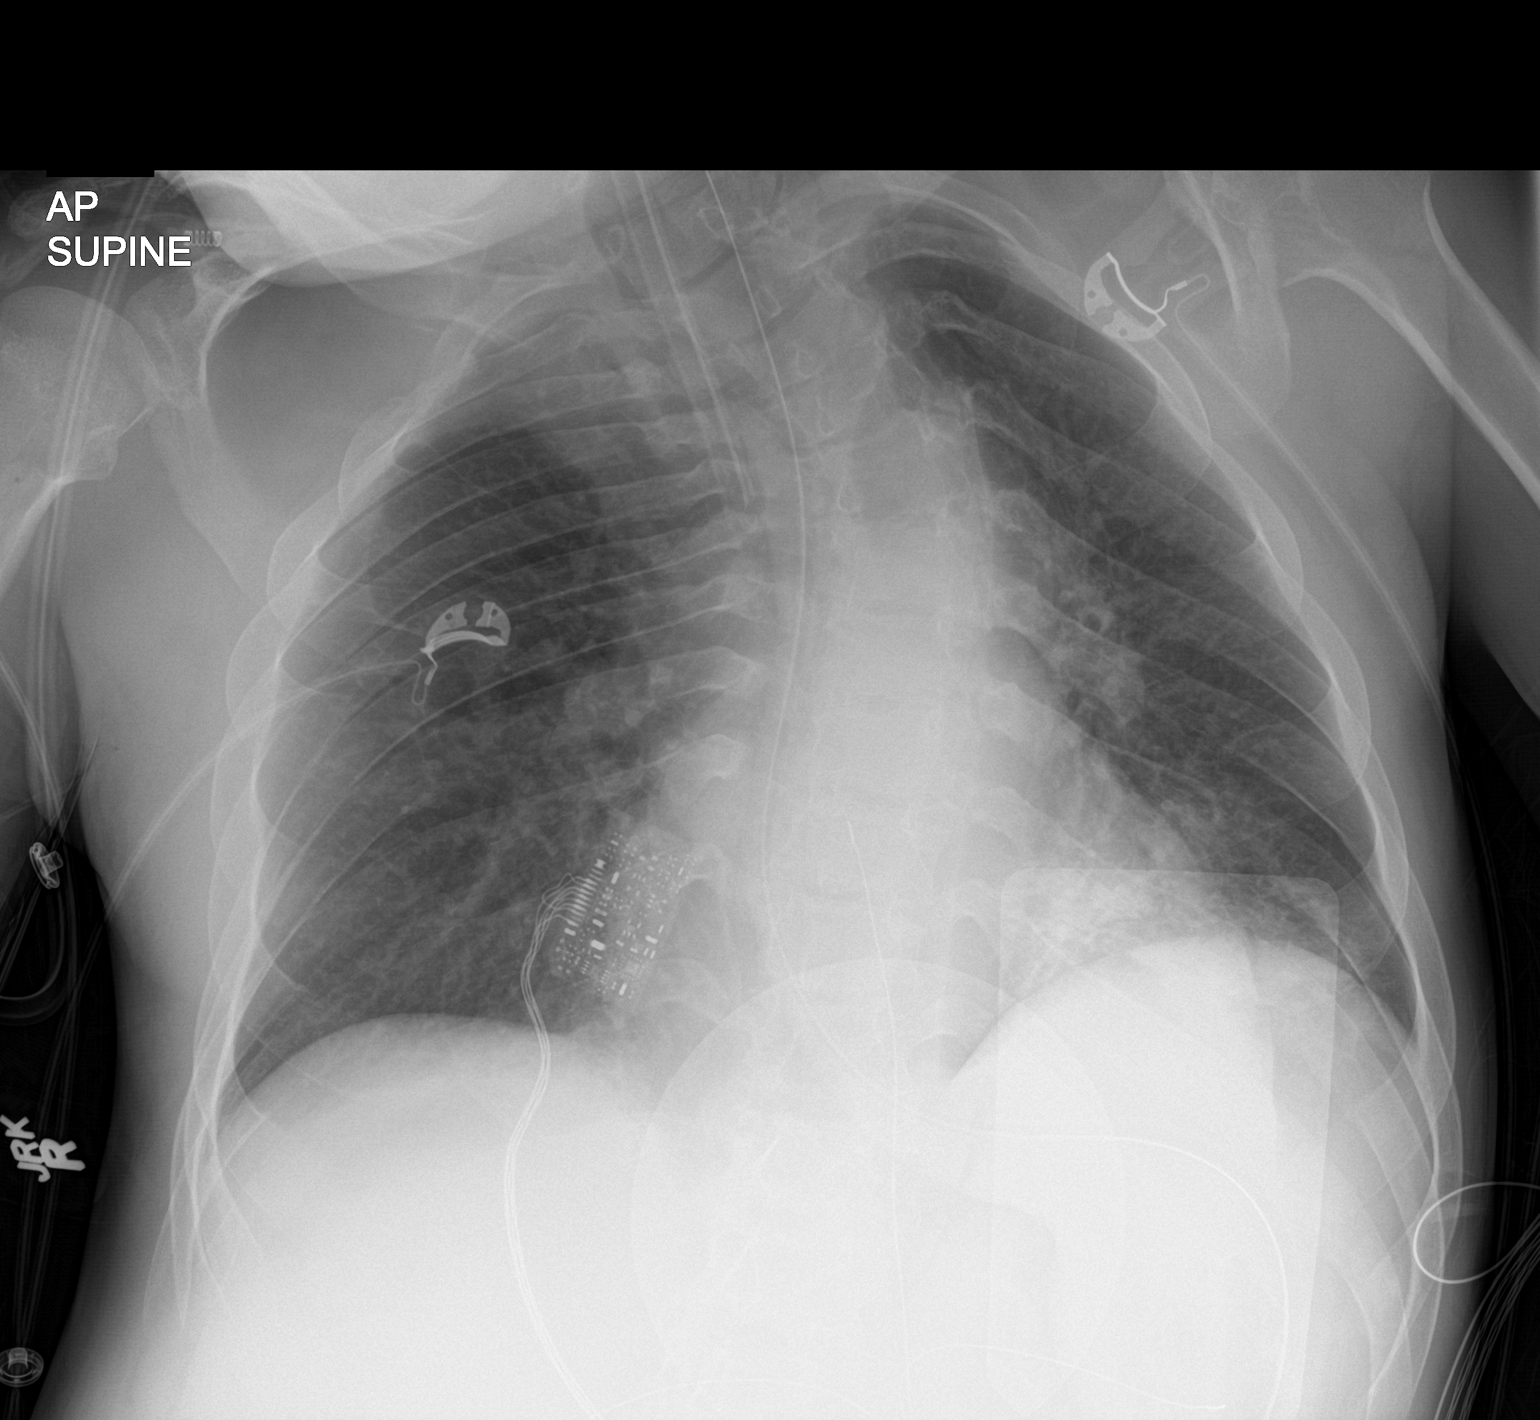

[1 of 1 positions shown; findings below may reference images not displayed]

FINDINGS: The endotracheal tube is 3 cm above the carina. The nasogastric tube
extends well into the stomach. No pneumothorax. No large effusion.
No dense airspace consolidation.
IMPRESSION: Support equipment appears satisfactorily positioned.
